# Patient Record
Sex: Female | Born: 1995 | Race: White | Hispanic: No | Marital: Single | Smoking: Current every day smoker
Health system: Southern US, Community
[De-identification: ages and names within clinical notes are randomized; demographics above are authoritative.]

## PROBLEM LIST (undated history)

## (undated) DIAGNOSIS — F419 Anxiety disorder, unspecified: Secondary | ICD-10-CM

## (undated) DIAGNOSIS — K219 Gastro-esophageal reflux disease without esophagitis: Secondary | ICD-10-CM

## (undated) DIAGNOSIS — F32A Depression, unspecified: Secondary | ICD-10-CM

## (undated) HISTORY — PX: CHOLECYSTECTOMY: SHX55

## (undated) HISTORY — PX: EYE SURGERY: SHX253

---
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
## 2022-08-05 ENCOUNTER — Encounter (HOSPITAL_BASED_OUTPATIENT_CLINIC_OR_DEPARTMENT_OTHER): Payer: Self-pay | Admitting: Obstetrics and Gynecology

## 2022-08-05 NOTE — H&P (Signed)
Gynecology History and Physical Preadmission H&P for scheduled procedure.  Norma Alexander is a 27 y.o. female G1P1 presenting for laparoscopic tubal sterilization.   She has a past medical history of anxiety, bipolar, PMDD.  She has a past surgical history of emergent C section and laparoscopic cholecystectomy.    She reports strong and longstanding desire for permanent sterilization. She states she is certain of this choice. She does not desire attempt with alternative birth control methods. She uses plan B frequently and does not react well to this.   She has a history of emergent C section in 2014 due to placenta abruption. She also reports that pregnancy was very uncomfortable and never wants to experience gestation again.    OB History   No obstetric history on file.    Past Medical History:  Diagnosis Date   Anxiety    Depression    GERD (gastroesophageal reflux disease)    Past Surgical History:  Procedure Laterality Date   CESAREAN SECTION     CHOLECYSTECTOMY     Family History: family history is not on file. Social History:  reports that she has been smoking cigarettes. She has a 10.00 pack-year smoking history. She has never used smokeless tobacco. She reports current drug use. Drug: Marijuana. No history on file for alcohol use.   Review of Systems - Patient denies fever, chills, SOB, CP, N/V/D.  History   Height 4\' 8"  (1.422 m), weight 83.9 kg, last menstrual period 07/22/2022. Exam Physical Exam   Gen: alert, well appearing, no distress Chest: nonlabored breathing CV: no peripheral edema Abdomen: soft, nontender Ext: no evidence of DVT    Assessment/Plan: Admit for planned procedure Patient strongly desires permanent sterilization. Discussed alternative methods in detail, including risks and benefits of each. Discussed risks of laparoscopic tubal sterilization in detail, which include but are not limited to bleeding, infection, damage to nearby organs,  pain, incisional hernia. Also discussed the important risk of surgical regret with sterilization. I reiterated this throughout our conversation and discussed that this should be treated as a permanent procedure Discussed this will not positively impact menses or hormonal cycling.   Carlyon Shadow 08/05/2022, 10:36 PM

## 2022-08-05 NOTE — Progress Notes (Signed)
Spoke w/ via phone for pre-op interview---Norma Alexander needs dos---- CBC and T&S per surgeon and UPT per anesthesia              Alexander results------ COVID test -----patient states asymptomatic no test needed Arrive at -------0530 NPO after MN NO Solid Food.  Med rec completed Medications to take morning of surgery -----Vraylar, Lamictal, Topamax and Buspar Diabetic medication ----- Patient instructed no nail polish to be worn day of surgery Patient instructed to bring photo id and insurance card day of surgery Patient aware to have Driver (ride ) / caregiver Fiance Norma Alexander   for 24 hours after surgery  Patient Special Instructions ----- Pre-Op special Istructions ----- Patient verbalized understanding of instructions that were given at this phone interview. Patient denies shortness of breath, chest pain, fever, cough at this phone interview.

## 2022-08-06 ENCOUNTER — Encounter (HOSPITAL_BASED_OUTPATIENT_CLINIC_OR_DEPARTMENT_OTHER)
Admission: RE | Disposition: A | Discharge status: Home / Self Care | Payer: Self-pay | Source: Home / Self Care | Attending: Obstetrics and Gynecology

## 2022-08-06 ENCOUNTER — Other Ambulatory Visit: Payer: Self-pay

## 2022-08-06 ENCOUNTER — Ambulatory Visit (HOSPITAL_BASED_OUTPATIENT_CLINIC_OR_DEPARTMENT_OTHER): Payer: BC Managed Care – PPO | Admitting: Certified Registered"

## 2022-08-06 ENCOUNTER — Encounter (HOSPITAL_BASED_OUTPATIENT_CLINIC_OR_DEPARTMENT_OTHER): Payer: Self-pay | Admitting: Obstetrics and Gynecology

## 2022-08-06 ENCOUNTER — Ambulatory Visit (HOSPITAL_BASED_OUTPATIENT_CLINIC_OR_DEPARTMENT_OTHER)
Admission: RE | Admit: 2022-08-06 | Discharge: 2022-08-06 | Disposition: A | Discharge status: Home / Self Care | Payer: BC Managed Care – PPO | Attending: Obstetrics and Gynecology | Admitting: Obstetrics and Gynecology

## 2022-08-06 DIAGNOSIS — F17210 Nicotine dependence, cigarettes, uncomplicated: Secondary | ICD-10-CM | POA: Insufficient documentation

## 2022-08-06 DIAGNOSIS — K219 Gastro-esophageal reflux disease without esophagitis: Secondary | ICD-10-CM | POA: Diagnosis not present

## 2022-08-06 DIAGNOSIS — F419 Anxiety disorder, unspecified: Secondary | ICD-10-CM | POA: Insufficient documentation

## 2022-08-06 DIAGNOSIS — Z302 Encounter for sterilization: Secondary | ICD-10-CM | POA: Insufficient documentation

## 2022-08-06 DIAGNOSIS — F1290 Cannabis use, unspecified, uncomplicated: Secondary | ICD-10-CM | POA: Diagnosis not present

## 2022-08-06 DIAGNOSIS — F32A Depression, unspecified: Secondary | ICD-10-CM | POA: Diagnosis not present

## 2022-08-06 DIAGNOSIS — Z6841 Body Mass Index (BMI) 40.0 and over, adult: Secondary | ICD-10-CM | POA: Diagnosis not present

## 2022-08-06 HISTORY — DX: Anxiety disorder, unspecified: F41.9

## 2022-08-06 HISTORY — DX: Depression, unspecified: F32.A

## 2022-08-06 HISTORY — DX: Gastro-esophageal reflux disease without esophagitis: K21.9

## 2022-08-06 HISTORY — PX: LAPAROSCOPIC TUBAL LIGATION: SHX1937

## 2022-08-06 LAB — TYPE AND SCREEN
ABO/RH(D): O POS
Antibody Screen: NEGATIVE

## 2022-08-06 LAB — POCT PREGNANCY, URINE: Preg Test, Ur: NEGATIVE

## 2022-08-06 LAB — CBC
HCT: 41 % (ref 36.0–46.0)
Hemoglobin: 14 g/dL (ref 12.0–15.0)
MCH: 33.3 pg (ref 26.0–34.0)
MCHC: 34.1 g/dL (ref 30.0–36.0)
MCV: 97.4 fL (ref 80.0–100.0)
Platelets: 251 10*3/uL (ref 150–400)
RBC: 4.21 MIL/uL (ref 3.87–5.11)
RDW: 12.9 % (ref 11.5–15.5)
WBC: 8.9 10*3/uL (ref 4.0–10.5)
nRBC: 0 % (ref 0.0–0.2)

## 2022-08-06 LAB — ABO/RH: ABO/RH(D): O POS

## 2022-08-06 SURGERY — LIGATION, FALLOPIAN TUBE, LAPAROSCOPIC
Anesthesia: General | Site: Abdomen | Laterality: Bilateral

## 2022-08-06 MED ORDER — FENTANYL CITRATE (PF) 100 MCG/2ML IJ SOLN
INTRAMUSCULAR | Status: AC
  Filled 2022-08-06: qty 2

## 2022-08-06 MED ORDER — ACETAMINOPHEN 500 MG PO TABS
1000.0000 mg | ORAL_TABLET | Freq: Once | ORAL | Status: AC
  Administered 2022-08-06: 1000 mg via ORAL

## 2022-08-06 MED ORDER — ROCURONIUM BROMIDE 10 MG/ML (PF) SYRINGE
PREFILLED_SYRINGE | INTRAVENOUS | Status: DC | PRN
  Administered 2022-08-06: 50 mg via INTRAVENOUS

## 2022-08-06 MED ORDER — MIDAZOLAM HCL 2 MG/2ML IJ SOLN
INTRAMUSCULAR | Status: AC
  Filled 2022-08-06: qty 2

## 2022-08-06 MED ORDER — BUPIVACAINE HCL (PF) 0.5 % IJ SOLN
INTRAMUSCULAR | Status: AC
  Filled 2022-08-06: qty 30

## 2022-08-06 MED ORDER — IBUPROFEN 800 MG PO TABS: 800.0000 mg | ORAL_TABLET | Freq: Three times a day (TID) | ORAL | 0 refills | Status: AC | PRN

## 2022-08-06 MED ORDER — LIDOCAINE HCL (PF) 2 % IJ SOLN
INTRAMUSCULAR | Status: AC
  Filled 2022-08-06: qty 5

## 2022-08-06 MED ORDER — ONDANSETRON HCL 4 MG/2ML IJ SOLN
INTRAMUSCULAR | Status: AC
  Filled 2022-08-06: qty 2

## 2022-08-06 MED ORDER — SODIUM CHLORIDE (PF) 0.9 % IJ SOLN
INTRAMUSCULAR | Status: DC | PRN
  Administered 2022-08-06: 500 mL

## 2022-08-06 MED ORDER — DIPHENHYDRAMINE HCL 50 MG/ML IJ SOLN
INTRAMUSCULAR | Status: AC
  Filled 2022-08-06: qty 1

## 2022-08-06 MED ORDER — FENTANYL CITRATE (PF) 100 MCG/2ML IJ SOLN
INTRAMUSCULAR | Status: DC | PRN
  Administered 2022-08-06 (×2): 50 ug via INTRAVENOUS
  Administered 2022-08-06 (×2): 25 ug via INTRAVENOUS

## 2022-08-06 MED ORDER — LIDOCAINE 2% (20 MG/ML) 5 ML SYRINGE
INTRAMUSCULAR | Status: DC | PRN
  Administered 2022-08-06: 60 mg via INTRAVENOUS

## 2022-08-06 MED ORDER — ACETAMINOPHEN 500 MG PO TABS: 500.0000 mg | ORAL_TABLET | Freq: Four times a day (QID) | ORAL | 0 refills | Status: AC | PRN

## 2022-08-06 MED ORDER — FENTANYL CITRATE (PF) 100 MCG/2ML IJ SOLN: 25.0000 ug | INTRAMUSCULAR | Status: DC | PRN

## 2022-08-06 MED ORDER — PROPOFOL 10 MG/ML IV BOLUS
INTRAVENOUS | Status: AC
  Filled 2022-08-06: qty 20

## 2022-08-06 MED ORDER — MIDAZOLAM HCL 5 MG/5ML IJ SOLN
INTRAMUSCULAR | Status: DC | PRN
  Administered 2022-08-06: 2 mg via INTRAVENOUS

## 2022-08-06 MED ORDER — DEXAMETHASONE SODIUM PHOSPHATE 10 MG/ML IJ SOLN
INTRAMUSCULAR | Status: DC | PRN
  Administered 2022-08-06: 10 mg via INTRAVENOUS

## 2022-08-06 MED ORDER — LACTATED RINGERS IV SOLN: INTRAVENOUS | Status: DC

## 2022-08-06 MED ORDER — ONDANSETRON HCL 4 MG/2ML IJ SOLN
INTRAMUSCULAR | Status: DC | PRN
  Administered 2022-08-06: 4 mg via INTRAVENOUS

## 2022-08-06 MED ORDER — ACETAMINOPHEN 500 MG PO TABS
ORAL_TABLET | ORAL | Status: AC
  Filled 2022-08-06: qty 2

## 2022-08-06 MED ORDER — PROPOFOL 10 MG/ML IV BOLUS
INTRAVENOUS | Status: DC | PRN
  Administered 2022-08-06: 150 mg via INTRAVENOUS

## 2022-08-06 MED ORDER — POVIDONE-IODINE 10 % EX SWAB: 2.0000 | Freq: Once | CUTANEOUS | Status: DC

## 2022-08-06 MED ORDER — SUGAMMADEX SODIUM 200 MG/2ML IV SOLN
INTRAVENOUS | Status: DC | PRN
  Administered 2022-08-06: 200 mg via INTRAVENOUS

## 2022-08-06 MED ORDER — DEXAMETHASONE SODIUM PHOSPHATE 10 MG/ML IJ SOLN
INTRAMUSCULAR | Status: AC
  Filled 2022-08-06: qty 1

## 2022-08-06 MED ORDER — ROCURONIUM BROMIDE 10 MG/ML (PF) SYRINGE
PREFILLED_SYRINGE | INTRAVENOUS | Status: AC
  Filled 2022-08-06: qty 10

## 2022-08-06 MED ORDER — DIPHENHYDRAMINE HCL 50 MG/ML IJ SOLN
12.5000 mg | Freq: Once | INTRAMUSCULAR | Status: AC
  Administered 2022-08-06: 12.5 mg via INTRAVENOUS

## 2022-08-06 MED ORDER — BUPIVACAINE HCL (PF) 0.5 % IJ SOLN
INTRAMUSCULAR | Status: DC | PRN
  Administered 2022-08-06: 8 mL

## 2022-08-06 SURGICAL SUPPLY — 24 items
ADH SKN CLS APL DERMABOND .7 (GAUZE/BANDAGES/DRESSINGS) ×1
CATH ROBINSON RED A/P 16FR (CATHETERS) ×1 IMPLANT
CLIP FILSHIE TUBAL LIGA STRL (Clip) ×1 IMPLANT
DERMABOND ADVANCED .7 DNX12 (GAUZE/BANDAGES/DRESSINGS) ×1 IMPLANT
DRAPE SURG IRRIG POUCH 19X23 (DRAPES) ×1 IMPLANT
DRSG OPSITE POSTOP 3X4 (GAUZE/BANDAGES/DRESSINGS) IMPLANT
GLOVE BIOGEL PI IND STRL 7.0 (GLOVE) ×1 IMPLANT
GLOVE ECLIPSE 7.0 STRL STRAW (GLOVE) ×2 IMPLANT
GLOVE INDICATOR 7.5 STRL GRN (GLOVE) ×1 IMPLANT
GOWN STRL REUS W/TWL LRG LVL3 (GOWN DISPOSABLE) ×1 IMPLANT
KIT PINK PAD W/HEAD ARE REST (MISCELLANEOUS) ×1
KIT PINK PAD W/HEAD ARM REST (MISCELLANEOUS) ×1 IMPLANT
KIT TURNOVER CYSTO (KITS) ×1 IMPLANT
PACK LAPAROSCOPY BASIN (CUSTOM PROCEDURE TRAY) ×1 IMPLANT
SCISSORS LAP 5X45 EPIX DISP (ENDOMECHANICALS) IMPLANT
SET TUBE SMOKE EVAC HIGH FLOW (TUBING) ×1 IMPLANT
SLEEVE SCD COMPRESS KNEE MED (STOCKING) ×1 IMPLANT
SLEEVE Z-THREAD 5X100MM (TROCAR) ×1 IMPLANT
SUT MON AB 4-0 PS1 27 (SUTURE) ×1 IMPLANT
SUT VICRYL 0 UR6 27IN ABS (SUTURE) IMPLANT
TOWEL OR 17X24 6PK STRL BLUE (TOWEL DISPOSABLE) ×2 IMPLANT
TROCAR Z-THREAD FIOS 11X100 BL (TROCAR) ×1 IMPLANT
TROCAR Z-THREAD FIOS 5X100MM (TROCAR) ×1 IMPLANT
WARMER LAPAROSCOPE (MISCELLANEOUS) ×1 IMPLANT

## 2022-08-06 NOTE — Anesthesia Postprocedure Evaluation (Signed)
Anesthesia Post Note  Patient: Norma Alexander  Procedure(s) Performed: LAPAROSCOPIC TUBAL LIGATION (Bilateral: Abdomen)     Patient location during evaluation: PACU Anesthesia Type: General Level of consciousness: awake and alert Pain management: pain level controlled Vital Signs Assessment: post-procedure vital signs reviewed and stable Respiratory status: spontaneous breathing, nonlabored ventilation, respiratory function stable and patient connected to nasal cannula oxygen Cardiovascular status: blood pressure returned to baseline and stable Postop Assessment: no apparent nausea or vomiting Anesthetic complications: no  No notable events documented.  Last Vitals:  Vitals:   08/06/22 0915 08/06/22 0936  BP: 111/73 127/89  Pulse: 80 67  Resp: (!) 23 16  Temp: 36.6 C   SpO2: 97% 96%    Last Pain:  Vitals:   08/06/22 0936  TempSrc:   PainSc: 4                  Otis Burress L Cesilia Shinn

## 2022-08-06 NOTE — Anesthesia Preprocedure Evaluation (Addendum)
Anesthesia Evaluation  Patient identified by MRN, date of birth, ID band Patient awake    Reviewed: Allergy & Precautions, NPO status , Patient's Chart, lab work & pertinent test results  Airway Mallampati: III  TM Distance: >3 FB Neck ROM: Full    Dental  (+) Dental Advisory Given, Chipped,    Pulmonary Current Smoker and Patient abstained from smoking.   Pulmonary exam normal breath sounds clear to auscultation       Cardiovascular negative cardio ROS Normal cardiovascular exam Rhythm:Regular Rate:Normal     Neuro/Psych  PSYCHIATRIC DISORDERS Anxiety     negative neurological ROS     GI/Hepatic ,GERD  ,,(+)     substance abuse  marijuana use  Endo/Other    Morbid obesity (BMI 42)  Renal/GU negative Renal ROS  negative genitourinary   Musculoskeletal negative musculoskeletal ROS (+)    Abdominal   Peds  Hematology negative hematology ROS (+)   Anesthesia Other Findings   Reproductive/Obstetrics                             Anesthesia Physical Anesthesia Plan  ASA: 3  Anesthesia Plan: General   Post-op Pain Management: Tylenol PO (pre-op)*   Induction: Intravenous  PONV Risk Score and Plan: 3 and Midazolam, Dexamethasone and Ondansetron  Airway Management Planned: Oral ETT  Additional Equipment:   Intra-op Plan:   Post-operative Plan: Extubation in OR  Informed Consent: I have reviewed the patients History and Physical, chart, labs and discussed the procedure including the risks, benefits and alternatives for the proposed anesthesia with the patient or authorized representative who has indicated his/her understanding and acceptance.     Dental advisory given  Plan Discussed with: Anesthesiologist  Anesthesia Plan Comments:        Anesthesia Quick Evaluation

## 2022-08-06 NOTE — Anesthesia Procedure Notes (Signed)
Procedure Name: Intubation Date/Time: 08/06/2022 7:39 AM  Performed by: Cassia Fein D, CRNAPre-anesthesia Checklist: Patient identified, Emergency Drugs available, Suction available and Patient being monitored Patient Re-evaluated:Patient Re-evaluated prior to induction Oxygen Delivery Method: Circle system utilized Preoxygenation: Pre-oxygenation with 100% oxygen Induction Type: IV induction Ventilation: Mask ventilation without difficulty Laryngoscope Size: Mac and 3 Grade View: Grade I Tube type: Oral Tube size: 7.0 mm Number of attempts: 1 Airway Equipment and Method: Stylet and Oral airway Placement Confirmation: ETT inserted through vocal cords under direct vision, positive ETCO2 and breath sounds checked- equal and bilateral Secured at: 20 cm Tube secured with: Tape Dental Injury: Teeth and Oropharynx as per pre-operative assessment

## 2022-08-06 NOTE — Op Note (Signed)
Operative Note  PREOPERATIVE DIAGNOSES: 1. Desires permanent sterilization  POSTOPERATIVE DIAGNOSES: 1. Same  PROCEDURE PERFORMED: Laparoscopic tubal ligation with cautery  SURGEON: Dr. Alpha Gula MD  ANESTHESIA: General   ESTIMATED BLOOD LOSS: 5 cc.  URINE OUTPUT: No catheterization  FLUIDS:  Per anesthesia record  COMPLICATIONS: None   TUBES: None.  PATHOLOGY: No specimen  FINDINGS: On exam, under anesthesia, normal appearing vulva and vagina, a normal sized uterus, normal appearing pelvis and appendix. Normal appearing RUQ s/p cholecystectomy. The bowel and omentum were normal appearing.  Procedure: A general anesthesia was induced and the patient was placed in the dirsal lithotomy position. The abdomen, perineum, and vagina were prepped and draped in the usual fashion. A foley catheter inserted into the bladder and attached to straight drainage. After the initial preparation, the procedure commenced at the vagina. With a speculum in place to visualize the cervix, the cervix was grasped and a single tooth tenaculum on the anterior lip of the cervix and Acorn cannula was placed.. Attention was then turned to the abdomen.    Following infiltration with Marcaine, an infraumbilical incision was made. A 11 mm trocar was then passed through the same incision and the laparoscope was then inserted through the trocar sleeve.   Visualization of the peritoneal cavity was then obtained and a brief inspection did not reveal any signs of complications from entry.   The fallopian tubes were vizualized bilaterally and confirmed by following to fimbriae.  A 2-3 cm segment was cauterized with Kleppenger and then ligated with laparoscopic scissor.  This process was repeated on each side. Excellent hemostasis was noted.   The trocar was then removed and abdomen exsufflated. The fascia was closed with vicryl.  No defect noted on digital exam.    The acorn cannula was removed and single tooth  tenaculum removed.   The patient's procedure was terminated. We then awakened her. She was sent to the Recovery Room in good condition.   Photos of procedure were taken and a copy given to family.    Alpha Gula MD

## 2022-08-06 NOTE — Transfer of Care (Signed)
Immediate Anesthesia Transfer of Care Note  Patient: Norma Alexander  Procedure(s) Performed: LAPAROSCOPIC TUBAL LIGATION (Bilateral: Abdomen)  Patient Location: PACU  Anesthesia Type:General  Level of Consciousness: awake, alert , and oriented  Airway & Oxygen Therapy: Patient Spontanous Breathing and Patient connected to nasal cannula oxygen  Post-op Assessment: Report given to RN and Post -op Vital signs reviewed and stable  Post vital signs: Reviewed and stable  Last Vitals:  Vitals Value Taken Time  BP 115/62 08/06/22 0845  Temp    Pulse 80 08/06/22 0845  Resp 31 08/06/22 0845  SpO2 95 % 08/06/22 0845  Vitals shown include unvalidated device data.  Last Pain:  Vitals:   08/06/22 0604  TempSrc: Oral  PainSc: 0-No pain      Patients Stated Pain Goal: 4 ([id number])  Complications: No notable events documented.

## 2022-08-06 NOTE — Progress Notes (Signed)
All questions answered, no changes to above H&P.  Reiterated her certainty for sterilization. UPT negative today. Discussed risks again and recovery.  Encouraged continued smoking cessation.  Alpha Gula MD

## 2022-08-06 NOTE — Discharge Instructions (Signed)
   No acetaminophen/Tylenol until after 1:00 pm today if needed.  Post Anesthesia Home Care Instructions  Activity: Get plenty of rest for the remainder of the day. A responsible individual must stay with you for 24 hours following the procedure.  For the next 24 hours, DO NOT: -Drive a car -Operate machinery -Drink alcoholic beverages -Take any medication unless instructed by your physician -Make any legal decisions or sign important papers.  Meals: Start with liquid foods such as gelatin or soup. Progress to regular foods as tolerated. Avoid greasy, spicy, heavy foods. If nausea and/or vomiting occur, drink only clear liquids until the nausea and/or vomiting subsides. Call your physician if vomiting continues.  Special Instructions/Symptoms: Your throat may feel dry or sore from the anesthesia or the breathing tube placed in your throat during surgery. If this causes discomfort, gargle with warm salt water. The discomfort should disappear within 24 hours.  

## 2022-08-09 ENCOUNTER — Encounter (HOSPITAL_BASED_OUTPATIENT_CLINIC_OR_DEPARTMENT_OTHER): Payer: Self-pay | Admitting: Obstetrics and Gynecology
